# Patient Record
Sex: Male | Born: 1965 | Race: White | Hispanic: No | Marital: Married | Smoking: Former smoker
Health system: Southern US, Community
[De-identification: ages and names within clinical notes are randomized; demographics above are authoritative.]

---

## 2009-12-21 ENCOUNTER — Emergency Department (HOSPITAL_BASED_OUTPATIENT_CLINIC_OR_DEPARTMENT_OTHER): Admission: EM | Admit: 2009-12-21 | Discharge: 2009-12-21 | Payer: Self-pay | Admitting: Emergency Medicine

## 2013-08-10 ENCOUNTER — Other Ambulatory Visit: Payer: Self-pay | Admitting: Family Medicine

## 2013-08-10 DIAGNOSIS — E079 Disorder of thyroid, unspecified: Secondary | ICD-10-CM

## 2013-10-03 ENCOUNTER — Ambulatory Visit
Admission: RE | Admit: 2013-10-03 | Discharge: 2013-10-03 | Disposition: A | Discharge status: Home / Self Care | Payer: BC Managed Care – PPO | Source: Ambulatory Visit | Attending: Family Medicine | Admitting: Family Medicine

## 2013-10-03 DIAGNOSIS — E079 Disorder of thyroid, unspecified: Secondary | ICD-10-CM

## 2013-10-09 ENCOUNTER — Other Ambulatory Visit: Payer: Self-pay | Admitting: Family Medicine

## 2013-10-09 DIAGNOSIS — E041 Nontoxic single thyroid nodule: Secondary | ICD-10-CM

## 2013-10-24 ENCOUNTER — Inpatient Hospital Stay
Admission: RE | Admit: 2013-10-24 | Discharge: 2013-10-24 | Disposition: A | Discharge status: Home / Self Care | Payer: BC Managed Care – PPO | Source: Ambulatory Visit | Attending: Family Medicine | Admitting: Family Medicine

## 2013-11-01 ENCOUNTER — Ambulatory Visit
Admission: RE | Admit: 2013-11-01 | Discharge: 2013-11-01 | Disposition: A | Discharge status: Home / Self Care | Payer: BC Managed Care – PPO | Source: Ambulatory Visit | Attending: Family Medicine | Admitting: Family Medicine

## 2013-11-01 ENCOUNTER — Other Ambulatory Visit (HOSPITAL_COMMUNITY)
Admission: RE | Admit: 2013-11-01 | Discharge: 2013-11-01 | Disposition: A | Discharge status: Home / Self Care | Payer: BC Managed Care – PPO | Source: Ambulatory Visit | Attending: Diagnostic Radiology | Admitting: Diagnostic Radiology

## 2013-11-01 DIAGNOSIS — E041 Nontoxic single thyroid nodule: Secondary | ICD-10-CM

## 2019-10-19 ENCOUNTER — Ambulatory Visit (INDEPENDENT_AMBULATORY_CARE_PROVIDER_SITE_OTHER): Payer: 59

## 2019-10-19 ENCOUNTER — Ambulatory Visit (INDEPENDENT_AMBULATORY_CARE_PROVIDER_SITE_OTHER): Payer: 59 | Admitting: Podiatry

## 2019-10-19 ENCOUNTER — Encounter: Payer: Self-pay | Admitting: Podiatry

## 2019-10-19 ENCOUNTER — Other Ambulatory Visit: Payer: Self-pay

## 2019-10-19 DIAGNOSIS — M722 Plantar fascial fibromatosis: Secondary | ICD-10-CM | POA: Diagnosis not present

## 2019-10-19 MED ORDER — MELOXICAM 15 MG PO TABS: 15.0000 mg | ORAL_TABLET | Freq: Every day | ORAL | 3 refills | Status: AC

## 2019-10-19 MED ORDER — METHYLPREDNISOLONE 4 MG PO TBPK: ORAL_TABLET | ORAL | 0 refills | Status: AC

## 2019-10-19 NOTE — Progress Notes (Signed)
  Subjective:  Patient ID: Maurice Taylor, male    DOB: 12-17-1965,  MRN: [id number] HPI Chief Complaint  Patient presents with  . Foot Pain    Plantar heel right - aching x 8 months, AM pain, active in biking, tried decreasing activity, also tried ice, Ibuprofen and rest  . New Patient (Initial Visit)    54 y.o. male presents with the above complaint.   ROS: Denies fever chills nausea vomiting muscle aches pains calf pain back pain chest pain shortness of breath.  No past medical history on file.   Current Outpatient Medications:  .  meloxicam (MOBIC) 15 MG tablet, Take 1 tablet (15 mg total) by mouth daily., Disp: 30 tablet, Rfl: 3 .  methylPREDNISolone (MEDROL DOSEPAK) 4 MG TBPK tablet, 6 day dose pack - take as directed, Disp: 21 tablet, Rfl: 0  No Known Allergies Review of Systems Objective:  There were no vitals filed for this visit.  General: Well developed, nourished, in no acute distress, alert and oriented x3   Dermatological: Skin is warm, dry and supple bilateral. Nails x 10 are well maintained; remaining integument appears unremarkable at this time. There are no open sores, no preulcerative lesions, no rash or signs of infection present.  Vascular: Dorsalis Pedis artery and Posterior Tibial artery pedal pulses are 2/4 bilateral with immedate capillary fill time. Pedal hair growth present. No varicosities and no lower extremity edema present bilateral.   Neruologic: Grossly intact via light touch bilateral. Vibratory intact via tuning fork bilateral. Protective threshold with Semmes Wienstein monofilament intact to all pedal sites bilateral. Patellar and Achilles deep tendon reflexes 2+ bilateral. No Babinski or clonus noted bilateral.   Musculoskeletal: No gross boney pedal deformities bilateral. No pain, crepitus, or limitation noted with foot and ankle range of motion bilateral. Muscular strength 5/5 in all groups tested bilateral.  He has pain on palpation medial  calcaneal tubercle of the right heel.  Gait: Unassisted, Nonantalgic.    Radiographs:  Radiographs taken today demonstrate a soft tissue increase in density plantar fascial insertion site of the right heel and a small plantar distally oriented calcaneal heel spur..  No acute findings noted.  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  Plan: Discussed etiology pathology conservative surgical therapies injected the right heel today with 20 g Kenalog 5 mg Marcaine after sterile Betadine skin prep.  Tolerated procedure well without complications.  Placed in a plantar fascial night splint plantar fascial brace did not fit his foot well.  Discussed appropriate shoe gear stretching exercise ice therapy sugar modifications.  I also started him on methylprednisolone to be followed by meloxicam.  I will follow-up with him in 1 month     Max T. [place], [place]

## 2019-10-19 NOTE — Patient Instructions (Signed)

## 2019-10-20 ENCOUNTER — Encounter: Payer: Self-pay | Admitting: Podiatry

## 2019-11-07 ENCOUNTER — Encounter: Payer: Self-pay | Admitting: Podiatry

## 2019-11-13 ENCOUNTER — Telehealth: Payer: Self-pay | Admitting: Podiatry

## 2019-11-13 NOTE — Telephone Encounter (Signed)
Pt's wife called requesting her husband's records be released to his new doctor. I told her that Mr. Dehoog would need to fill out and sign a medical records release form. Pt's wife requested I e-mail the form to him at [email or web address]. I told her he would need to print it out and physically sign it after completing the form.

## 2019-11-16 ENCOUNTER — Ambulatory Visit: Payer: 59 | Admitting: Podiatry

## 2020-01-09 ENCOUNTER — Other Ambulatory Visit: Payer: Self-pay | Admitting: Family Medicine

## 2020-01-09 ENCOUNTER — Ambulatory Visit
Admission: RE | Admit: 2020-01-09 | Discharge: 2020-01-09 | Disposition: A | Discharge status: Home / Self Care | Payer: 59 | Source: Ambulatory Visit | Attending: Family Medicine | Admitting: Family Medicine

## 2020-01-09 DIAGNOSIS — C6990 Malignant neoplasm of unspecified site of unspecified eye: Secondary | ICD-10-CM

## 2020-01-11 ENCOUNTER — Other Ambulatory Visit: Payer: Self-pay | Admitting: Family Medicine

## 2020-01-11 DIAGNOSIS — C6910 Malignant neoplasm of unspecified cornea: Secondary | ICD-10-CM

## 2020-01-12 ENCOUNTER — Ambulatory Visit
Admission: RE | Admit: 2020-01-12 | Discharge: 2020-01-12 | Disposition: A | Discharge status: Home / Self Care | Payer: 59 | Source: Ambulatory Visit | Attending: Family Medicine | Admitting: Family Medicine

## 2020-01-12 ENCOUNTER — Other Ambulatory Visit: Payer: Self-pay

## 2020-01-12 DIAGNOSIS — C6910 Malignant neoplasm of unspecified cornea: Secondary | ICD-10-CM

## 2020-01-12 MED ORDER — IOPAMIDOL (ISOVUE-300) INJECTION 61%
100.0000 mL | Freq: Once | INTRAVENOUS | Status: AC | PRN
  Administered 2020-01-12: 100 mL via INTRAVENOUS

## 2020-08-21 ENCOUNTER — Other Ambulatory Visit: Payer: Self-pay | Admitting: Family Medicine

## 2020-08-21 DIAGNOSIS — Z85820 Personal history of malignant melanoma of skin: Secondary | ICD-10-CM

## 2020-09-16 ENCOUNTER — Ambulatory Visit
Admission: RE | Admit: 2020-09-16 | Discharge: 2020-09-16 | Disposition: A | Discharge status: Home / Self Care | Payer: BC Managed Care – PPO | Source: Ambulatory Visit | Attending: Family Medicine | Admitting: Family Medicine

## 2020-09-16 ENCOUNTER — Ambulatory Visit
Admission: RE | Admit: 2020-09-16 | Discharge: 2020-09-16 | Disposition: A | Discharge status: Home / Self Care | Payer: Self-pay | Source: Ambulatory Visit | Attending: Family Medicine | Admitting: Family Medicine

## 2020-09-16 DIAGNOSIS — R911 Solitary pulmonary nodule: Secondary | ICD-10-CM | POA: Diagnosis not present

## 2020-09-16 DIAGNOSIS — Z85820 Personal history of malignant melanoma of skin: Secondary | ICD-10-CM

## 2020-09-16 DIAGNOSIS — D7389 Other diseases of spleen: Secondary | ICD-10-CM | POA: Diagnosis not present

## 2020-09-16 DIAGNOSIS — R918 Other nonspecific abnormal finding of lung field: Secondary | ICD-10-CM | POA: Diagnosis not present

## 2020-09-16 DIAGNOSIS — J984 Other disorders of lung: Secondary | ICD-10-CM | POA: Diagnosis not present

## 2020-09-16 MED ORDER — IOPAMIDOL (ISOVUE-300) INJECTION 61%
100.0000 mL | Freq: Once | INTRAVENOUS | Status: AC | PRN
  Administered 2020-09-16: 100 mL via INTRAVENOUS

## 2020-11-05 DIAGNOSIS — C6932 Malignant neoplasm of left choroid: Secondary | ICD-10-CM | POA: Diagnosis not present

## 2020-11-05 DIAGNOSIS — H2513 Age-related nuclear cataract, bilateral: Secondary | ICD-10-CM | POA: Diagnosis not present

## 2020-11-05 DIAGNOSIS — Z923 Personal history of irradiation: Secondary | ICD-10-CM | POA: Diagnosis not present

## 2020-12-13 DIAGNOSIS — Z13220 Encounter for screening for lipoid disorders: Secondary | ICD-10-CM | POA: Diagnosis not present

## 2020-12-13 DIAGNOSIS — Z0000 Encounter for general adult medical examination without abnormal findings: Secondary | ICD-10-CM | POA: Diagnosis not present

## 2020-12-13 DIAGNOSIS — Z125 Encounter for screening for malignant neoplasm of prostate: Secondary | ICD-10-CM | POA: Diagnosis not present

## 2020-12-16 DIAGNOSIS — Z0000 Encounter for general adult medical examination without abnormal findings: Secondary | ICD-10-CM | POA: Diagnosis not present

## 2021-03-05 DIAGNOSIS — H3589 Other specified retinal disorders: Secondary | ICD-10-CM | POA: Diagnosis not present

## 2021-03-05 DIAGNOSIS — X58XXXA Exposure to other specified factors, initial encounter: Secondary | ICD-10-CM | POA: Diagnosis not present

## 2021-03-05 DIAGNOSIS — H2513 Age-related nuclear cataract, bilateral: Secondary | ICD-10-CM | POA: Diagnosis not present

## 2021-03-05 DIAGNOSIS — T66XXXA Radiation sickness, unspecified, initial encounter: Secondary | ICD-10-CM | POA: Diagnosis not present

## 2021-03-05 DIAGNOSIS — T66XXXD Radiation sickness, unspecified, subsequent encounter: Secondary | ICD-10-CM | POA: Diagnosis not present

## 2021-03-05 DIAGNOSIS — C6932 Malignant neoplasm of left choroid: Secondary | ICD-10-CM | POA: Diagnosis not present

## 2021-06-04 DIAGNOSIS — R350 Frequency of micturition: Secondary | ICD-10-CM | POA: Diagnosis not present

## 2021-09-10 DIAGNOSIS — H35352 Cystoid macular degeneration, left eye: Secondary | ICD-10-CM | POA: Diagnosis not present

## 2021-09-10 DIAGNOSIS — X58XXXA Exposure to other specified factors, initial encounter: Secondary | ICD-10-CM | POA: Diagnosis not present

## 2021-09-10 DIAGNOSIS — H2513 Age-related nuclear cataract, bilateral: Secondary | ICD-10-CM | POA: Diagnosis not present

## 2021-09-10 DIAGNOSIS — C6932 Malignant neoplasm of left choroid: Secondary | ICD-10-CM | POA: Diagnosis not present

## 2021-09-10 DIAGNOSIS — T66XXXA Radiation sickness, unspecified, initial encounter: Secondary | ICD-10-CM | POA: Diagnosis not present

## 2021-09-10 DIAGNOSIS — H3589 Other specified retinal disorders: Secondary | ICD-10-CM | POA: Diagnosis not present

## 2021-09-12 DIAGNOSIS — K649 Unspecified hemorrhoids: Secondary | ICD-10-CM | POA: Diagnosis not present

## 2021-09-12 DIAGNOSIS — Z1211 Encounter for screening for malignant neoplasm of colon: Secondary | ICD-10-CM | POA: Diagnosis not present

## 2021-09-12 DIAGNOSIS — D122 Benign neoplasm of ascending colon: Secondary | ICD-10-CM | POA: Diagnosis not present

## 2021-09-12 DIAGNOSIS — D125 Benign neoplasm of sigmoid colon: Secondary | ICD-10-CM | POA: Diagnosis not present

## 2021-09-18 DIAGNOSIS — C6930 Malignant neoplasm of unspecified choroid: Secondary | ICD-10-CM | POA: Diagnosis not present

## 2021-09-18 DIAGNOSIS — R918 Other nonspecific abnormal finding of lung field: Secondary | ICD-10-CM | POA: Diagnosis not present

## 2021-10-02 DIAGNOSIS — R3915 Urgency of urination: Secondary | ICD-10-CM | POA: Diagnosis not present

## 2021-10-22 DIAGNOSIS — T66XXXA Radiation sickness, unspecified, initial encounter: Secondary | ICD-10-CM | POA: Diagnosis not present

## 2021-10-22 DIAGNOSIS — C6932 Malignant neoplasm of left choroid: Secondary | ICD-10-CM | POA: Diagnosis not present

## 2021-10-22 DIAGNOSIS — H3589 Other specified retinal disorders: Secondary | ICD-10-CM | POA: Diagnosis not present

## 2021-10-22 DIAGNOSIS — H2513 Age-related nuclear cataract, bilateral: Secondary | ICD-10-CM | POA: Diagnosis not present

## 2021-10-22 DIAGNOSIS — X58XXXA Exposure to other specified factors, initial encounter: Secondary | ICD-10-CM | POA: Diagnosis not present

## 2021-10-22 DIAGNOSIS — H35352 Cystoid macular degeneration, left eye: Secondary | ICD-10-CM | POA: Diagnosis not present

## 2021-12-16 DIAGNOSIS — H2513 Age-related nuclear cataract, bilateral: Secondary | ICD-10-CM | POA: Diagnosis not present

## 2021-12-16 DIAGNOSIS — T66XXXA Radiation sickness, unspecified, initial encounter: Secondary | ICD-10-CM | POA: Diagnosis not present

## 2021-12-16 DIAGNOSIS — Z85840 Personal history of malignant neoplasm of eye: Secondary | ICD-10-CM | POA: Diagnosis not present

## 2021-12-16 DIAGNOSIS — H3589 Other specified retinal disorders: Secondary | ICD-10-CM | POA: Diagnosis not present

## 2021-12-22 DIAGNOSIS — Z125 Encounter for screening for malignant neoplasm of prostate: Secondary | ICD-10-CM | POA: Diagnosis not present

## 2021-12-22 DIAGNOSIS — Z13220 Encounter for screening for lipoid disorders: Secondary | ICD-10-CM | POA: Diagnosis not present

## 2021-12-22 DIAGNOSIS — Z0000 Encounter for general adult medical examination without abnormal findings: Secondary | ICD-10-CM | POA: Diagnosis not present

## 2021-12-29 DIAGNOSIS — Z0000 Encounter for general adult medical examination without abnormal findings: Secondary | ICD-10-CM | POA: Diagnosis not present

## 2022-01-22 DIAGNOSIS — T66XXXA Radiation sickness, unspecified, initial encounter: Secondary | ICD-10-CM | POA: Diagnosis not present

## 2022-01-22 DIAGNOSIS — H3581 Retinal edema: Secondary | ICD-10-CM | POA: Diagnosis not present

## 2022-01-22 DIAGNOSIS — C6932 Malignant neoplasm of left choroid: Secondary | ICD-10-CM | POA: Diagnosis not present

## 2022-01-22 DIAGNOSIS — H3509 Other intraretinal microvascular abnormalities: Secondary | ICD-10-CM | POA: Diagnosis not present

## 2022-03-18 DIAGNOSIS — H3589 Other specified retinal disorders: Secondary | ICD-10-CM | POA: Diagnosis not present

## 2022-03-18 DIAGNOSIS — X58XXXA Exposure to other specified factors, initial encounter: Secondary | ICD-10-CM | POA: Diagnosis not present

## 2022-03-18 DIAGNOSIS — C6932 Malignant neoplasm of left choroid: Secondary | ICD-10-CM | POA: Diagnosis not present

## 2022-03-18 DIAGNOSIS — H3509 Other intraretinal microvascular abnormalities: Secondary | ICD-10-CM | POA: Diagnosis not present

## 2022-03-18 DIAGNOSIS — T66XXXA Radiation sickness, unspecified, initial encounter: Secondary | ICD-10-CM | POA: Diagnosis not present

## 2022-03-18 DIAGNOSIS — H2513 Age-related nuclear cataract, bilateral: Secondary | ICD-10-CM | POA: Diagnosis not present

## 2022-05-14 DIAGNOSIS — C6932 Malignant neoplasm of left choroid: Secondary | ICD-10-CM | POA: Diagnosis not present

## 2022-05-14 DIAGNOSIS — T66XXXA Radiation sickness, unspecified, initial encounter: Secondary | ICD-10-CM | POA: Diagnosis not present

## 2022-05-14 DIAGNOSIS — H2513 Age-related nuclear cataract, bilateral: Secondary | ICD-10-CM | POA: Diagnosis not present

## 2022-05-14 DIAGNOSIS — H3509 Other intraretinal microvascular abnormalities: Secondary | ICD-10-CM | POA: Diagnosis not present

## 2022-06-05 IMAGING — CT CT CHEST W/ CM
2 of 5 series · 14 of 46 positions shown, 16 images · IV contrast (iopamidol)
Comparison: 01/12/2020

CLINICAL DATA: History of melanoma

EXAM:
CT CHEST, ABDOMEN, AND PELVIS WITH CONTRAST
TECHNIQUE: Multidetector CT imaging of the chest, abdomen and pelvis was
performed following the standard protocol during bolus
administration of intravenous contrast.
CONTRAST:  100mL 9I4TFT-4JJ IOPAMIDOL ([id number]-4JJ) INJECTION 61%

[Series 2: cap with 5.00 br40 s3 axial · axial · 0.87mm/px · z∈[+1254,+1849]mm · 11 of 143 slices shown, 13 images]
[im 12/143  soft-tissue]
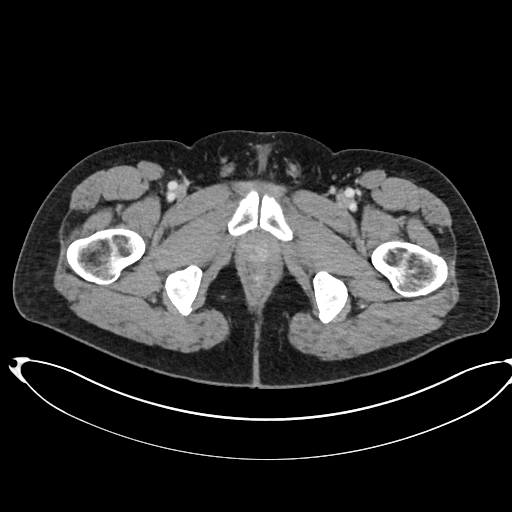
[im 12/143  bone]
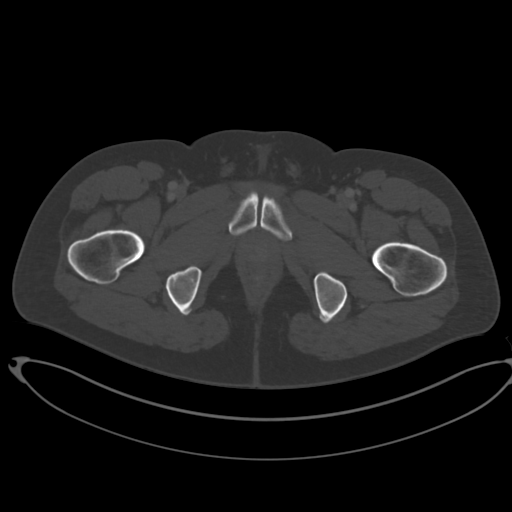
[im 24/143  soft-tissue]
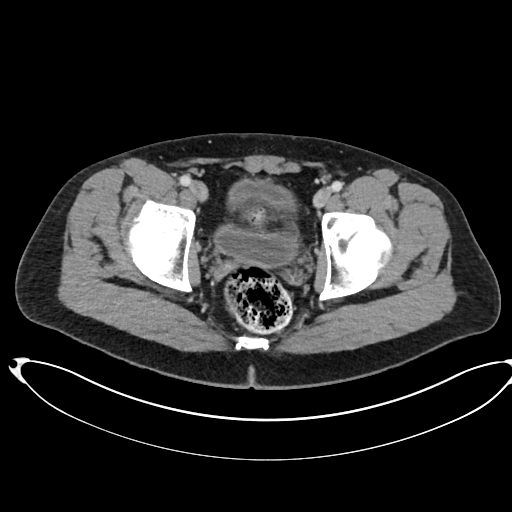
[im 36/143  soft-tissue]
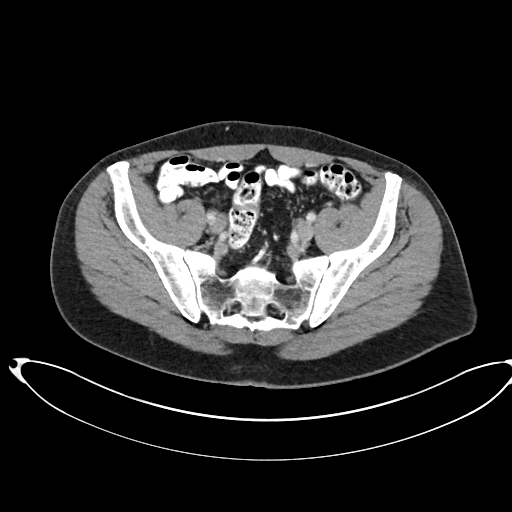
[im 48/143  soft-tissue]
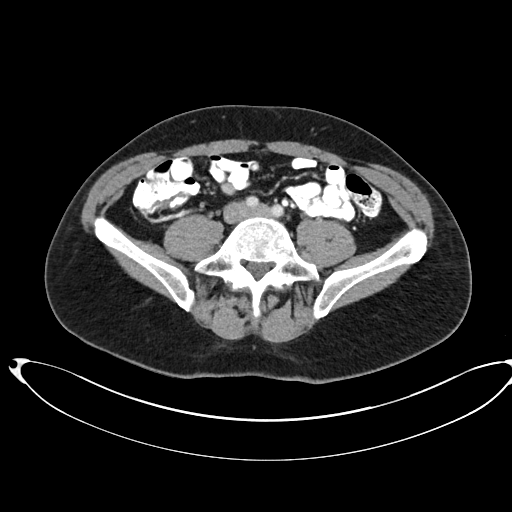
[im 60/143  soft-tissue]
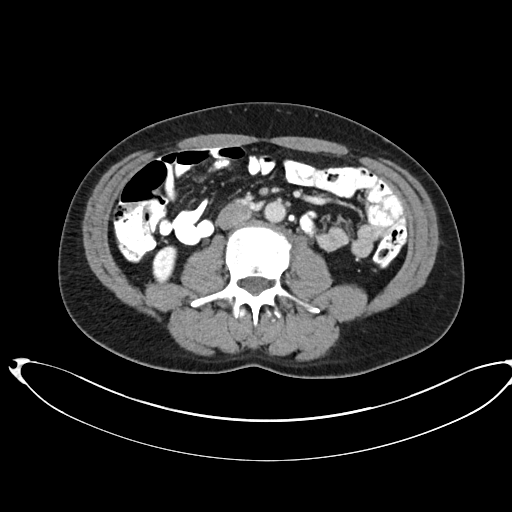
[im 72/143  soft-tissue]
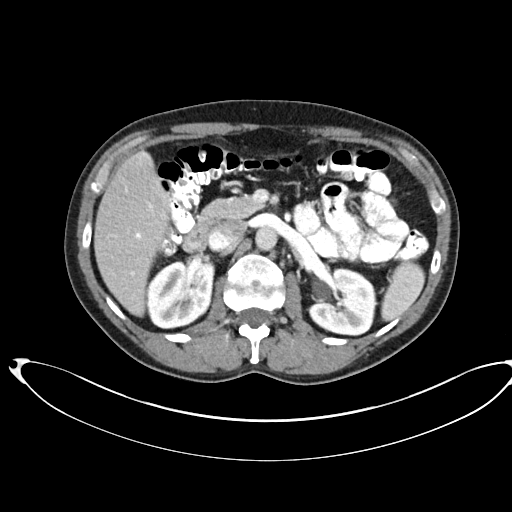
[im 83/143  soft-tissue]
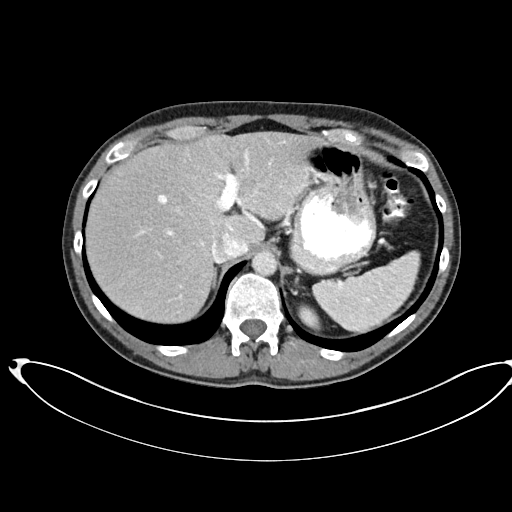
[im 95/143  soft-tissue]
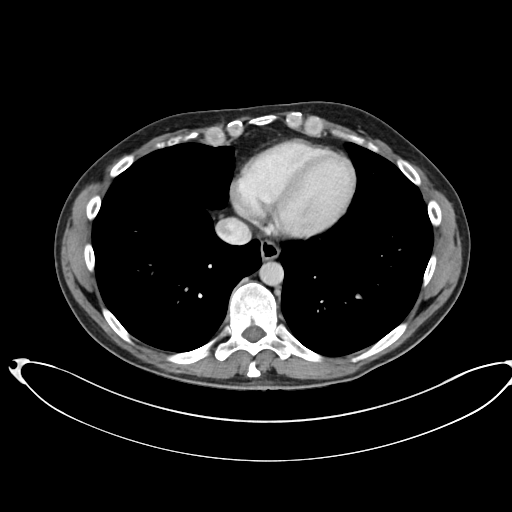
[im 107/143  soft-tissue]
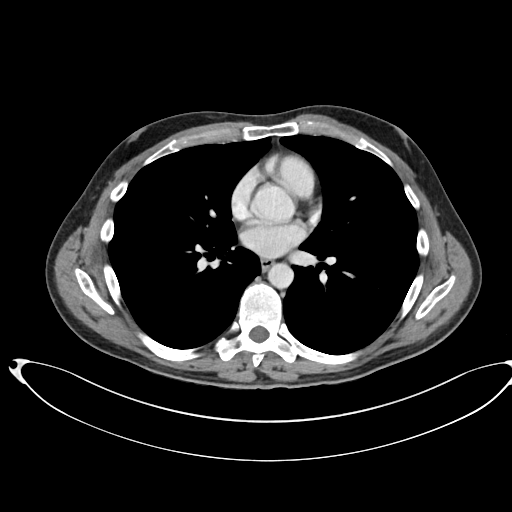
[im 107/143  bone]
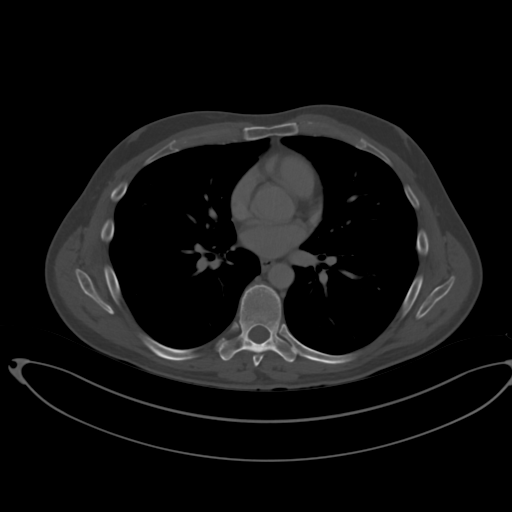
[im 119/143  soft-tissue]
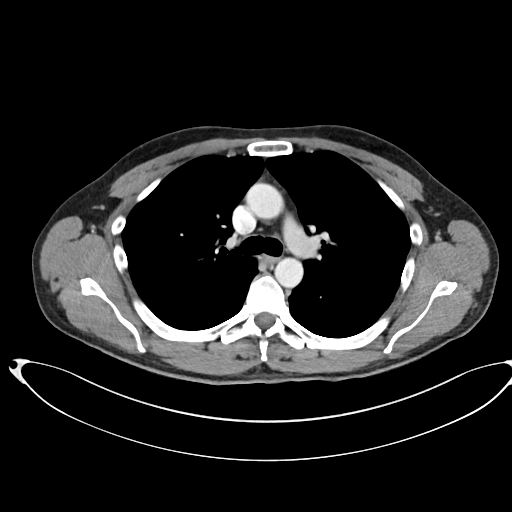
[im 131/143  soft-tissue]
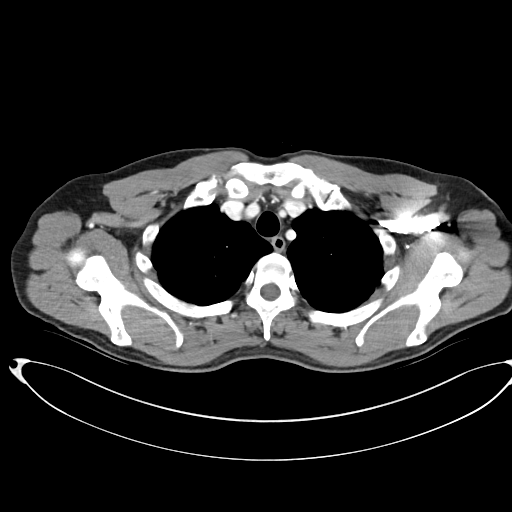

[Series 6: cap with 2.00 br40 s3 cor · coronal · 0.87mm/px · 3 of 221 slices shown]
[im 74/221  soft-tissue]
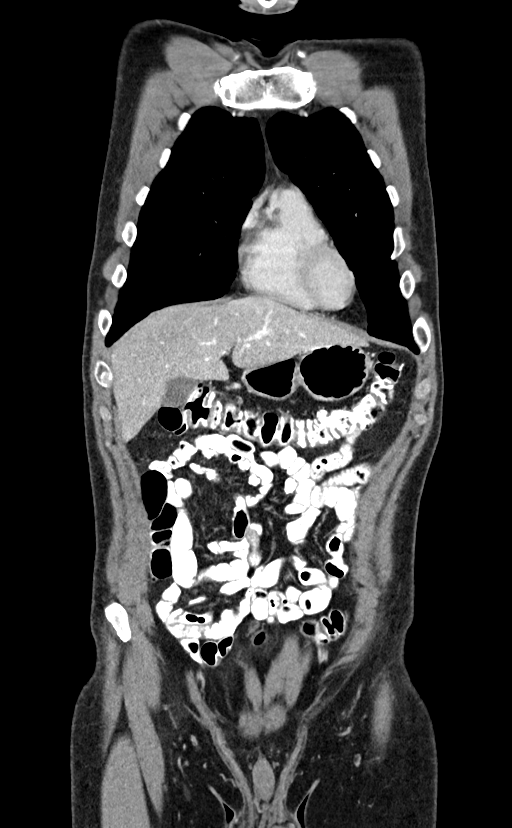
[im 98/221  soft-tissue]
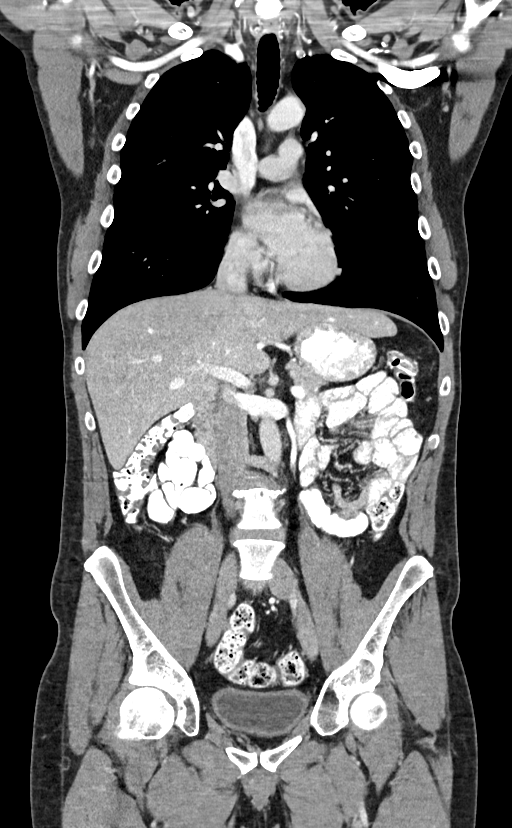
[im 123/221  soft-tissue]
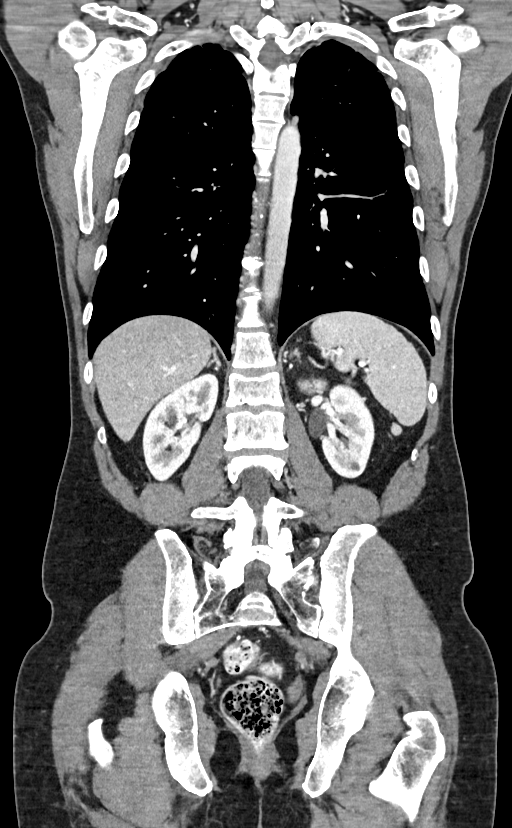

[14 of 46 positions shown; findings below may reference images not displayed]

FINDINGS: CT CHEST FINDINGS

Cardiovascular: Heart is normal size. Aorta is normal caliber.

Mediastinum/Nodes: No mediastinal, hilar, or axillary adenopathy.
Trachea and esophagus are unremarkable. Thyroid unremarkable.

Lungs/Pleura: 6 mm right lower lobe nodule on image 115 of series 4,
stable since prior study. 3 mm nodule in the left lower lobe on
image 127 of series 4. This is also stable since prior study. No new
or enlarging pulmonary nodules. No confluent opacities or pleural
effusions. Biapical scarring, stable.

Musculoskeletal: Chest wall soft tissues are unremarkable. No acute
bony abnormality or focal bone lesion.

CT ABDOMEN PELVIS FINDINGS

Hepatobiliary: No focal hepatic abnormality. Gallbladder
unremarkable.

Pancreas: No focal abnormality or ductal dilatation.

Spleen: No focal abnormality. Normal size. Small nodule adjacent to
the anterior spleen on image 64 of series 2, likely accessory
splenule, stable since prior study.

Adrenals/Urinary Tract: No adrenal abnormality. No focal renal
abnormality. No stones or hydronephrosis. Urinary bladder is
unremarkable.

Stomach/Bowel: Stomach, large and small bowel grossly unremarkable.

Vascular/Lymphatic: No evidence of aneurysm or adenopathy.

Reproductive: No visible focal abnormality.

Other: No free fluid or free air.

Musculoskeletal: No acute bony abnormality.
IMPRESSION: Stable bilateral lower lobe pulmonary nodules, the largest 6 mm in
the right lower lobe.

No new or enlarging pulmonary nodules.

Stable left upper quadrant nodule adjacent to the spleen, likely
accessory splenule.

No evidence for metastatic disease in the chest, abdomen or pelvis.

## 2022-06-05 IMAGING — CT CT ABD-PELV W/ CM
2 of 5 series · 14 of 46 positions shown, 16 images · IV contrast (iopamidol)
Comparison: 01/12/2020

CLINICAL DATA: History of melanoma

EXAM:
CT CHEST, ABDOMEN, AND PELVIS WITH CONTRAST
TECHNIQUE: Multidetector CT imaging of the chest, abdomen and pelvis was
performed following the standard protocol during bolus
administration of intravenous contrast.
CONTRAST:  100mL 9I4TFT-4JJ IOPAMIDOL (9I4TFT-4JJ) INJECTION 61%

[Series 2: cap with 5.00 br40 s3 axial · axial · 0.87mm/px · z∈[+1254,+1849]mm · 11 of 143 slices shown, 13 images]
[im 12/143  soft-tissue]
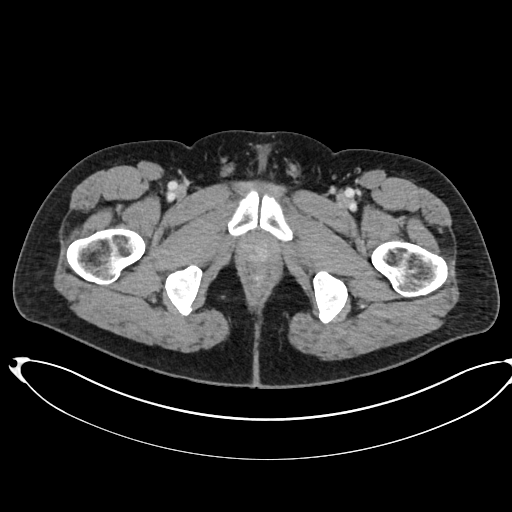
[im 12/143  bone]
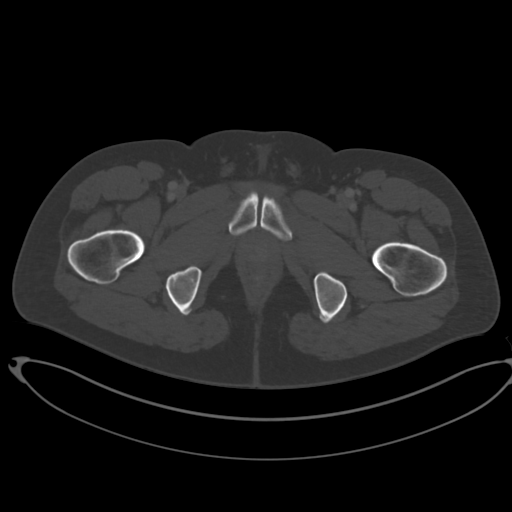
[im 24/143  soft-tissue]
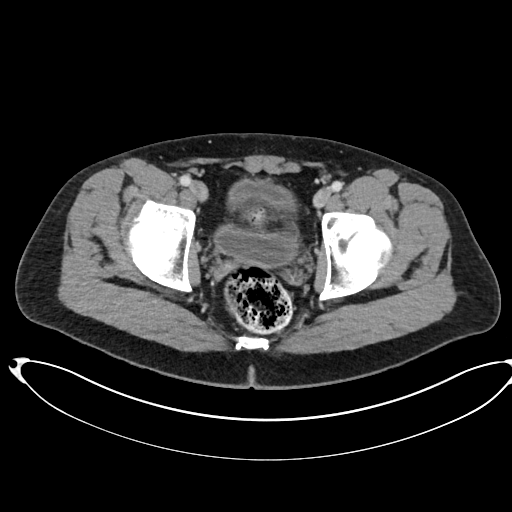
[im 36/143  soft-tissue]
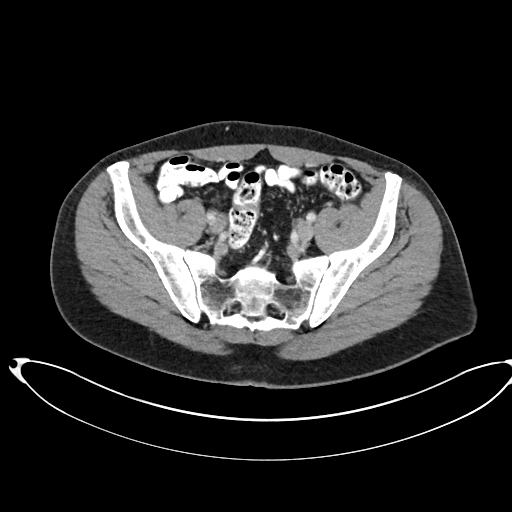
[im 48/143  soft-tissue]
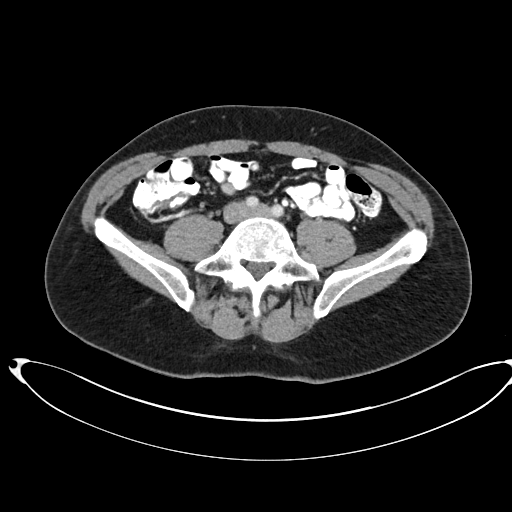
[im 60/143  soft-tissue]
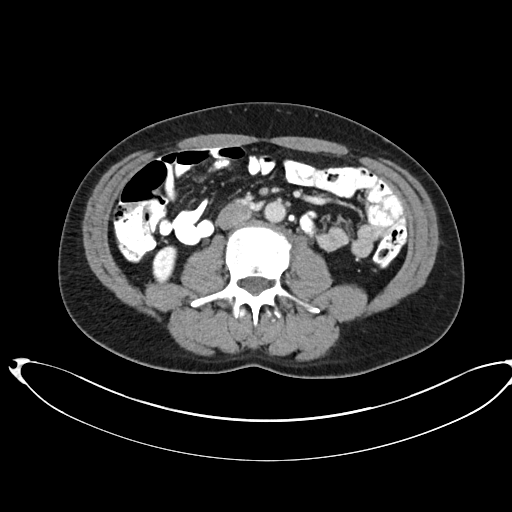
[im 72/143  soft-tissue]
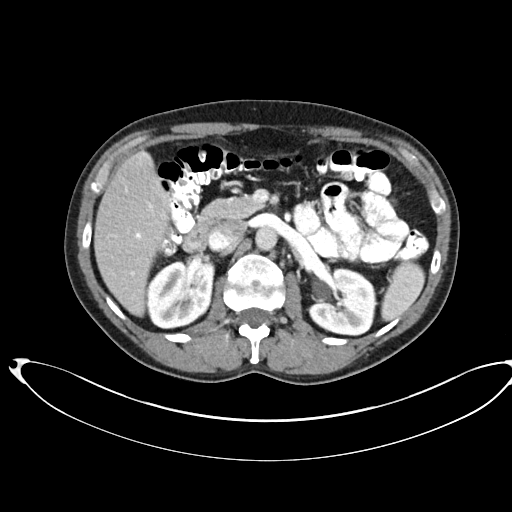
[im 83/143  soft-tissue]
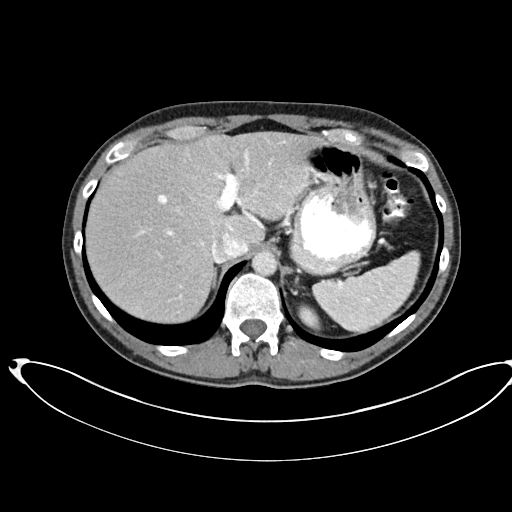
[im 95/143  soft-tissue]
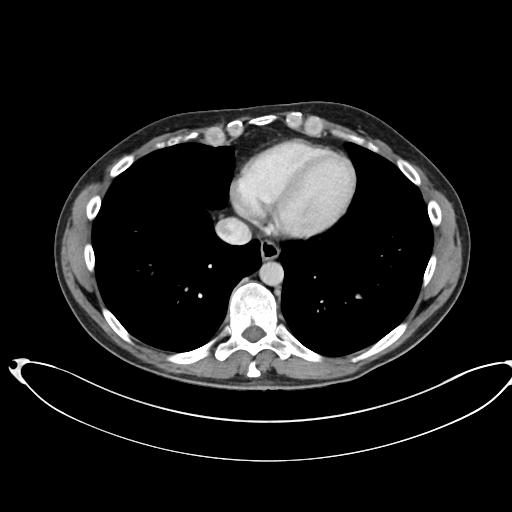
[im 107/143  soft-tissue]
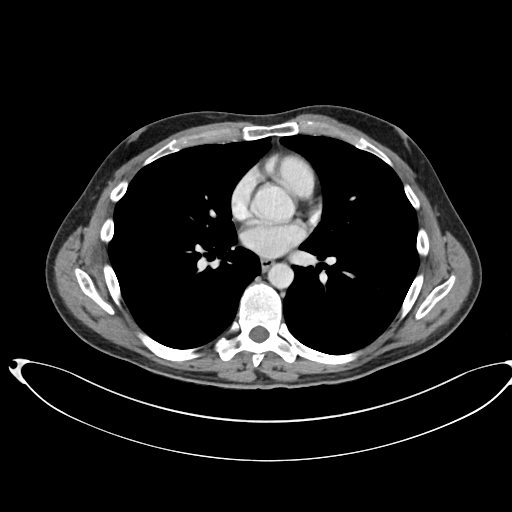
[im 107/143  bone]
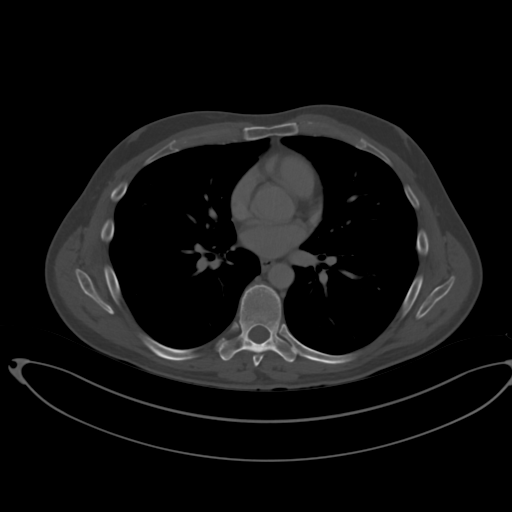
[im 119/143  soft-tissue]
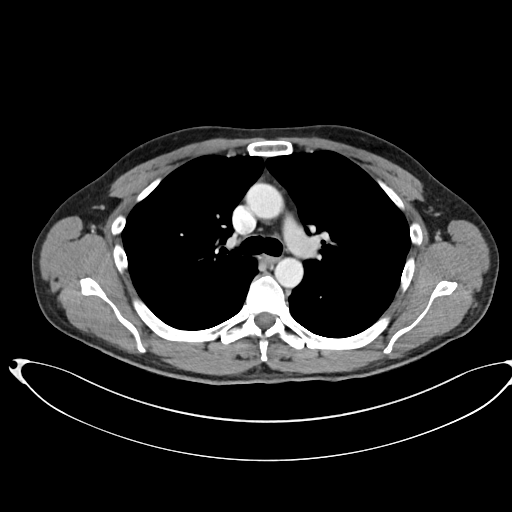
[im 131/143  soft-tissue]
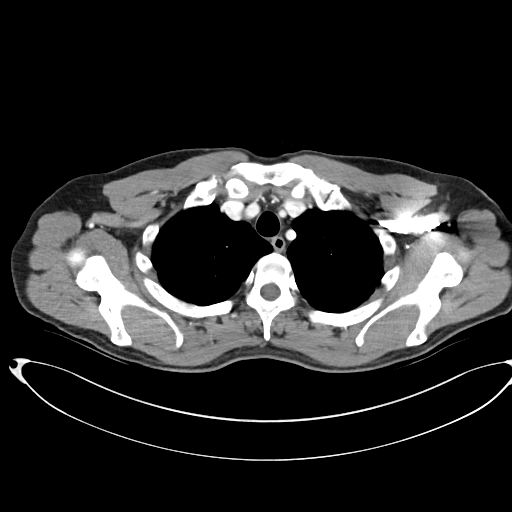

[Series 6: cap with 2.00 br40 s3 cor · coronal · 0.87mm/px · 3 of 221 slices shown]
[im 74/221  soft-tissue]
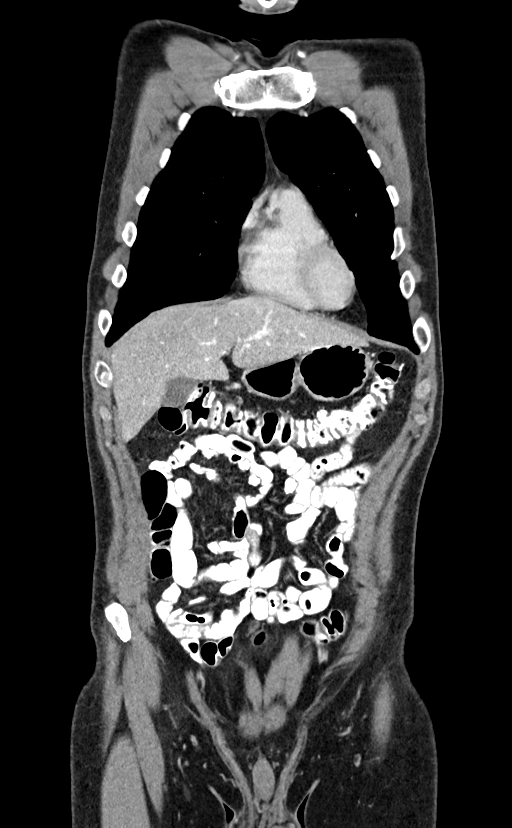
[im 98/221  soft-tissue]
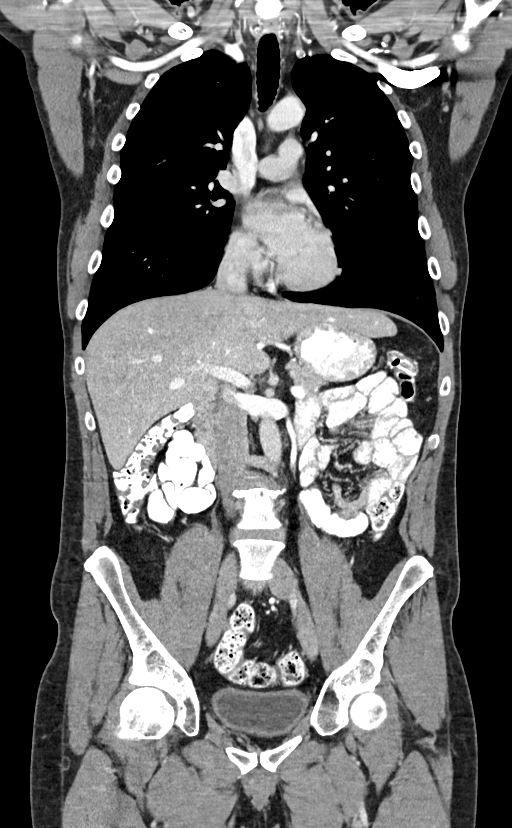
[im 123/221  soft-tissue]
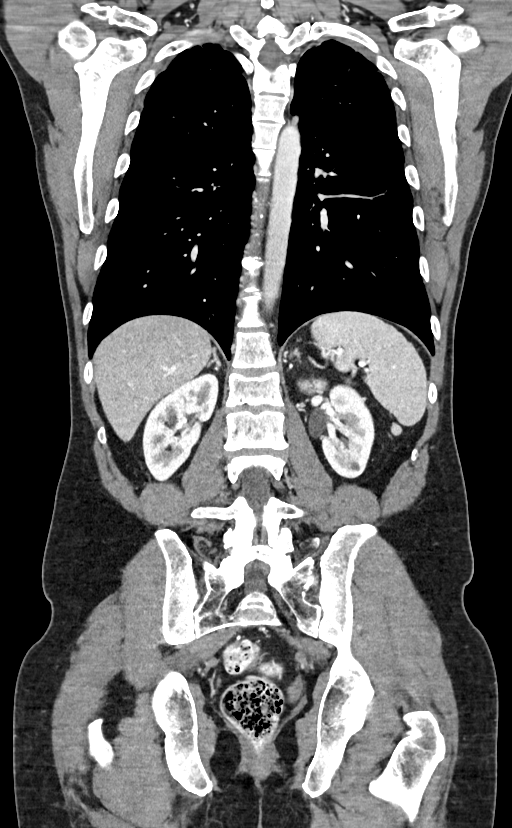

[14 of 46 positions shown; findings below may reference images not displayed]

FINDINGS: CT CHEST FINDINGS

Cardiovascular: Heart is normal size. Aorta is normal caliber.

Mediastinum/Nodes: No mediastinal, hilar, or axillary adenopathy.
Trachea and esophagus are unremarkable. Thyroid unremarkable.

Lungs/Pleura: 6 mm right lower lobe nodule on image 115 of series 4,
stable since prior study. 3 mm nodule in the left lower lobe on
image 127 of series 4. This is also stable since prior study. No new
or enlarging pulmonary nodules. No confluent opacities or pleural
effusions. Biapical scarring, stable.

Musculoskeletal: Chest wall soft tissues are unremarkable. No acute
bony abnormality or focal bone lesion.

CT ABDOMEN PELVIS FINDINGS

Hepatobiliary: No focal hepatic abnormality. Gallbladder
unremarkable.

Pancreas: No focal abnormality or ductal dilatation.

Spleen: No focal abnormality. Normal size. Small nodule adjacent to
the anterior spleen on image 64 of series 2, likely accessory
splenule, stable since prior study.

Adrenals/Urinary Tract: No adrenal abnormality. No focal renal
abnormality. No stones or hydronephrosis. Urinary bladder is
unremarkable.

Stomach/Bowel: Stomach, large and small bowel grossly unremarkable.

Vascular/Lymphatic: No evidence of aneurysm or adenopathy.

Reproductive: No visible focal abnormality.

Other: No free fluid or free air.

Musculoskeletal: No acute bony abnormality.
IMPRESSION: Stable bilateral lower lobe pulmonary nodules, the largest 6 mm in
the right lower lobe.

No new or enlarging pulmonary nodules.

Stable left upper quadrant nodule adjacent to the spleen, likely
accessory splenule.

No evidence for metastatic disease in the chest, abdomen or pelvis.

## 2022-06-25 DIAGNOSIS — H3589 Other specified retinal disorders: Secondary | ICD-10-CM | POA: Diagnosis not present

## 2022-06-25 DIAGNOSIS — H35352 Cystoid macular degeneration, left eye: Secondary | ICD-10-CM | POA: Diagnosis not present

## 2022-06-25 DIAGNOSIS — C6932 Malignant neoplasm of left choroid: Secondary | ICD-10-CM | POA: Diagnosis not present

## 2022-06-25 DIAGNOSIS — H3509 Other intraretinal microvascular abnormalities: Secondary | ICD-10-CM | POA: Diagnosis not present

## 2022-06-25 DIAGNOSIS — T66XXXA Radiation sickness, unspecified, initial encounter: Secondary | ICD-10-CM | POA: Diagnosis not present

## 2022-07-30 DIAGNOSIS — C6932 Malignant neoplasm of left choroid: Secondary | ICD-10-CM | POA: Diagnosis not present

## 2022-07-30 DIAGNOSIS — T66XXXA Radiation sickness, unspecified, initial encounter: Secondary | ICD-10-CM | POA: Diagnosis not present

## 2022-07-30 DIAGNOSIS — H3509 Other intraretinal microvascular abnormalities: Secondary | ICD-10-CM | POA: Diagnosis not present

## 2022-07-30 DIAGNOSIS — H35352 Cystoid macular degeneration, left eye: Secondary | ICD-10-CM | POA: Diagnosis not present

## 2022-08-24 DIAGNOSIS — R1013 Epigastric pain: Secondary | ICD-10-CM | POA: Diagnosis not present

## 2022-09-09 DIAGNOSIS — C6932 Malignant neoplasm of left choroid: Secondary | ICD-10-CM | POA: Diagnosis not present

## 2022-09-09 DIAGNOSIS — H2513 Age-related nuclear cataract, bilateral: Secondary | ICD-10-CM | POA: Diagnosis not present

## 2022-09-09 DIAGNOSIS — T66XXXA Radiation sickness, unspecified, initial encounter: Secondary | ICD-10-CM | POA: Diagnosis not present

## 2022-09-09 DIAGNOSIS — H35352 Cystoid macular degeneration, left eye: Secondary | ICD-10-CM | POA: Diagnosis not present

## 2022-09-09 DIAGNOSIS — H3589 Other specified retinal disorders: Secondary | ICD-10-CM | POA: Diagnosis not present

## 2022-09-09 DIAGNOSIS — H3509 Other intraretinal microvascular abnormalities: Secondary | ICD-10-CM | POA: Diagnosis not present

## 2022-09-09 DIAGNOSIS — H43812 Vitreous degeneration, left eye: Secondary | ICD-10-CM | POA: Diagnosis not present

## 2022-09-09 DIAGNOSIS — X58XXXA Exposure to other specified factors, initial encounter: Secondary | ICD-10-CM | POA: Diagnosis not present

## 2022-09-16 DIAGNOSIS — Z85820 Personal history of malignant melanoma of skin: Secondary | ICD-10-CM | POA: Diagnosis not present

## 2022-10-15 DIAGNOSIS — H3589 Other specified retinal disorders: Secondary | ICD-10-CM | POA: Diagnosis not present

## 2022-10-15 DIAGNOSIS — H3509 Other intraretinal microvascular abnormalities: Secondary | ICD-10-CM | POA: Diagnosis not present

## 2022-10-15 DIAGNOSIS — T66XXXA Radiation sickness, unspecified, initial encounter: Secondary | ICD-10-CM | POA: Diagnosis not present

## 2022-10-15 DIAGNOSIS — H35352 Cystoid macular degeneration, left eye: Secondary | ICD-10-CM | POA: Diagnosis not present

## 2022-11-19 DIAGNOSIS — T66XXXA Radiation sickness, unspecified, initial encounter: Secondary | ICD-10-CM | POA: Diagnosis not present

## 2022-11-19 DIAGNOSIS — H35352 Cystoid macular degeneration, left eye: Secondary | ICD-10-CM | POA: Diagnosis not present

## 2022-11-19 DIAGNOSIS — H3589 Other specified retinal disorders: Secondary | ICD-10-CM | POA: Diagnosis not present

## 2022-11-19 DIAGNOSIS — H3509 Other intraretinal microvascular abnormalities: Secondary | ICD-10-CM | POA: Diagnosis not present

## 2022-12-24 DIAGNOSIS — H35352 Cystoid macular degeneration, left eye: Secondary | ICD-10-CM | POA: Diagnosis not present

## 2022-12-24 DIAGNOSIS — H3509 Other intraretinal microvascular abnormalities: Secondary | ICD-10-CM | POA: Diagnosis not present

## 2022-12-24 DIAGNOSIS — T66XXXA Radiation sickness, unspecified, initial encounter: Secondary | ICD-10-CM | POA: Diagnosis not present

## 2022-12-24 DIAGNOSIS — H3589 Other specified retinal disorders: Secondary | ICD-10-CM | POA: Diagnosis not present

## 2023-01-12 DIAGNOSIS — Z13220 Encounter for screening for lipoid disorders: Secondary | ICD-10-CM | POA: Diagnosis not present

## 2023-01-12 DIAGNOSIS — Z125 Encounter for screening for malignant neoplasm of prostate: Secondary | ICD-10-CM | POA: Diagnosis not present

## 2023-01-21 DIAGNOSIS — Z0000 Encounter for general adult medical examination without abnormal findings: Secondary | ICD-10-CM | POA: Diagnosis not present

## 2023-02-18 DIAGNOSIS — H35352 Cystoid macular degeneration, left eye: Secondary | ICD-10-CM | POA: Diagnosis not present

## 2023-02-18 DIAGNOSIS — T66XXXA Radiation sickness, unspecified, initial encounter: Secondary | ICD-10-CM | POA: Diagnosis not present

## 2023-02-18 DIAGNOSIS — H3509 Other intraretinal microvascular abnormalities: Secondary | ICD-10-CM | POA: Diagnosis not present

## 2023-02-18 DIAGNOSIS — H3589 Other specified retinal disorders: Secondary | ICD-10-CM | POA: Diagnosis not present

## 2023-04-21 DIAGNOSIS — H3589 Other specified retinal disorders: Secondary | ICD-10-CM | POA: Diagnosis not present

## 2023-04-21 DIAGNOSIS — H2513 Age-related nuclear cataract, bilateral: Secondary | ICD-10-CM | POA: Diagnosis not present

## 2023-04-21 DIAGNOSIS — C6932 Malignant neoplasm of left choroid: Secondary | ICD-10-CM | POA: Diagnosis not present

## 2023-04-21 DIAGNOSIS — H3509 Other intraretinal microvascular abnormalities: Secondary | ICD-10-CM | POA: Diagnosis not present

## 2023-04-21 DIAGNOSIS — H35352 Cystoid macular degeneration, left eye: Secondary | ICD-10-CM | POA: Diagnosis not present

## 2023-04-21 DIAGNOSIS — T66XXXD Radiation sickness, unspecified, subsequent encounter: Secondary | ICD-10-CM | POA: Diagnosis not present

## 2023-06-03 DIAGNOSIS — T66XXXD Radiation sickness, unspecified, subsequent encounter: Secondary | ICD-10-CM | POA: Diagnosis not present

## 2023-06-03 DIAGNOSIS — H3589 Other specified retinal disorders: Secondary | ICD-10-CM | POA: Diagnosis not present

## 2023-06-03 DIAGNOSIS — C6932 Malignant neoplasm of left choroid: Secondary | ICD-10-CM | POA: Diagnosis not present

## 2023-06-03 DIAGNOSIS — H35352 Cystoid macular degeneration, left eye: Secondary | ICD-10-CM | POA: Diagnosis not present
